# Patient Record
Sex: Male | Born: 1960 | Race: Black or African American | Hispanic: No | Marital: Married | Smoking: Former smoker
Health system: Southern US, Community
[De-identification: ages and names within clinical notes are randomized; demographics above are authoritative.]

## PROBLEM LIST (undated history)

## (undated) DIAGNOSIS — N400 Enlarged prostate without lower urinary tract symptoms: Secondary | ICD-10-CM

## (undated) HISTORY — PX: OTHER SURGICAL HISTORY: SHX169

---

## 2003-08-26 ENCOUNTER — Encounter: Admission: RE | Admit: 2003-08-26 | Discharge: 2003-08-26 | Payer: Self-pay | Admitting: Specialist

## 2005-02-13 ENCOUNTER — Emergency Department: Payer: Self-pay | Admitting: Emergency Medicine

## 2013-03-27 ENCOUNTER — Other Ambulatory Visit: Payer: Self-pay | Admitting: Otolaryngology

## 2013-03-27 DIAGNOSIS — H905 Unspecified sensorineural hearing loss: Secondary | ICD-10-CM

## 2013-04-02 ENCOUNTER — Ambulatory Visit
Admission: RE | Admit: 2013-04-02 | Discharge: 2013-04-02 | Disposition: A | Discharge status: Home / Self Care | Payer: BC Managed Care – PPO | Source: Ambulatory Visit | Attending: Otolaryngology | Admitting: Otolaryngology

## 2013-04-02 DIAGNOSIS — H905 Unspecified sensorineural hearing loss: Secondary | ICD-10-CM

## 2013-04-02 MED ORDER — GADOBENATE DIMEGLUMINE 529 MG/ML IV SOLN
18.0000 mL | Freq: Once | INTRAVENOUS | Status: AC | PRN
  Administered 2013-04-02: 18 mL via INTRAVENOUS

## 2014-09-04 ENCOUNTER — Encounter (HOSPITAL_COMMUNITY): Payer: Self-pay | Admitting: Emergency Medicine

## 2014-09-04 ENCOUNTER — Emergency Department (HOSPITAL_COMMUNITY)
Admission: EM | Admit: 2014-09-04 | Discharge: 2014-09-04 | Disposition: A | Discharge status: Home / Self Care | Payer: 59 | Source: Home / Self Care | Attending: Family Medicine | Admitting: Family Medicine

## 2014-09-04 DIAGNOSIS — J020 Streptococcal pharyngitis: Secondary | ICD-10-CM

## 2014-09-04 HISTORY — DX: Benign prostatic hyperplasia without lower urinary tract symptoms: N40.0

## 2014-09-04 LAB — POCT RAPID STREP A: STREPTOCOCCUS, GROUP A SCREEN (DIRECT): POSITIVE — AB

## 2014-09-04 MED ORDER — AMOXICILLIN 500 MG PO CAPS: 500.0000 mg | ORAL_CAPSULE | Freq: Two times a day (BID) | ORAL | Status: DC

## 2014-09-04 NOTE — Discharge Instructions (Signed)
You have a bacterial infection of your throat culture throat. This will require antibiotics in order to clear. Please take the antibiotics for the full 10 days. Please use ibuprofen 600 mg every 6-8 hours fever and general aches and pains. You may return to work after you have been without fever for 24 hours.  Strep Throat Strep throat is an infection of the throat caused by a bacteria named Streptococcus pyogenes. Your health care provider may call the infection streptococcal "tonsillitis" or "pharyngitis" depending on whether there are signs of inflammation in the tonsils or back of the throat. Strep throat is most common in children aged 5-15 years during the cold months of the year, but it can occur in people of any age during any season. This infection is spread from person to person (contagious) through coughing, sneezing, or other close contact. SIGNS AND SYMPTOMS   Fever or chills.  Painful, swollen, red tonsils or throat.  Pain or difficulty when swallowing.  White or yellow spots on the tonsils or throat.  Swollen, tender lymph nodes or "glands" of the neck or under the jaw.  Red rash all over the body (rare). DIAGNOSIS  Many different infections can cause the same symptoms. A test must be done to confirm the diagnosis so the right treatment can be given. A "rapid strep test" can help your health care provider make the diagnosis in a few minutes. If this test is not available, a light swab of the infected area can be used for a throat culture test. If a throat culture test is done, results are usually available in a day or two. TREATMENT  Strep throat is treated with antibiotic medicine. HOME CARE INSTRUCTIONS   Gargle with 1 tsp of salt in 1 cup of warm water, 3-4 times per day or as needed for comfort.  Family members who also have a sore throat or fever should be tested for strep throat and treated with antibiotics if they have the strep infection.  Make sure everyone in your  household washes their hands well.  Do not share food, drinking cups, or personal items that could cause the infection to spread to others.  You may need to eat a soft food diet until your sore throat gets better.  Drink enough water and fluids to keep your urine clear or pale yellow. This will help prevent dehydration.  Get plenty of rest.  Stay home from school, day care, or work until you have been on antibiotics for 24 hours.  Take medicines only as directed by your health care provider.  Take your antibiotic medicine as directed by your health care provider. Finish it even if you start to feel better. SEEK MEDICAL CARE IF:   The glands in your neck continue to enlarge.  You develop a rash, cough, or earache.  You cough up green, yellow-brown, or bloody sputum.  You have pain or discomfort not controlled by medicines.  Your problems seem to be getting worse rather than better.  You have a fever. SEEK IMMEDIATE MEDICAL CARE IF:   You develop any new symptoms such as vomiting, severe headache, stiff or painful neck, chest pain, shortness of breath, or trouble swallowing.  You develop severe throat pain, drooling, or changes in your voice.  You develop swelling of the neck, or the skin on the neck becomes red and tender.  You develop signs of dehydration, such as fatigue, dry mouth, and decreased urination.  You become increasingly sleepy, or you cannot wake  up completely. MAKE SURE YOU:  Understand these instructions.  Will watch your condition.  Will get help right away if you are not doing well or get worse. Document Released: 06/18/2000 Document Revised: 11/05/2013 Document Reviewed: 08/20/2010 [facility] Patient Information 2015 [place], [place]. This information is not intended to replace advice given to you by your health care provider. Make sure you discuss any questions you have with your health care provider.

## 2014-09-04 NOTE — ED Notes (Signed)
C/o cold sx onset yest Sx include: ST, HA, BA, fatigue, decreased appetite, fever Has been taking tyle and OTC cold meds w/no relief Alert, no signs of acute distress.

## 2014-09-04 NOTE — ED Provider Notes (Signed)
CSN: [id number]     Arrival date & time 09/04/14  0904 History   First MD Initiated Contact with Patient 09/04/14 [phone]     Chief Complaint  Patient presents with  . URI   (Consider location/radiation/quality/duration/timing/severity/associated sxs/prior Treatment) HPI  Developed body aches, HA, fevers, chills and sore throat. Started yesterday. Tylenol w/o much benefit. Dayquil w/o much benefit. Symptoms are are constant. CHills come and go. No change in overall symptoms since onset. Slept poorly due to symptoms.   Past Medical History  Diagnosis Date  . BPH (benign prostatic hyperplasia)    Past Surgical History  Procedure Laterality Date  . 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 Heart disease Father    History  Substance Use Topics  . Smoking status: Never Smoker   . Smokeless tobacco: Not on file  . Alcohol Use: Yes    Review of Systems Per HPI with all other pertinent systems negative.   Allergies  Review of patient's allergies indicates no known allergies.  Home Medications   Prior to Admission medications   Medication Sig Start Date End Date Taking? Authorizing Provider  tamsulosin (FLOMAX) 0.4 MG CAPS capsule Take 0.4 mg by mouth.   Yes Historical Provider, MD  amoxicillin (AMOXIL) 500 MG capsule Take 1 capsule (500 mg total) by mouth 2 (two) times daily. 09/04/14   Ozella Rocksavid J Merrell, MD   BP 127/70 mmHg  Pulse 76  Temp(Src) 100 F (37.8 C) (Oral)  Resp 18  SpO2 96% Physical Exam  Constitutional: He is oriented to person, place, and time. He appears well-developed and well-nourished.  HENT:  Head: Normocephalic and atraumatic.  Tonsils 0-1+ without exudate. Pharynx normal.  Eyes: EOM are normal. Pupils are equal, round, and reactive to light.  Neck: Normal range of motion.  Cardiovascular: Normal rate and normal heart sounds.   No murmur heard. Pulmonary/Chest: Effort normal  and breath sounds normal.  Abdominal: Soft. Bowel sounds are normal.  Musculoskeletal: Normal range of motion. He exhibits no edema or tenderness.  Neurological: He is alert and oriented to person, place, and time.  Skin: Skin is warm.  Psychiatric: His behavior is normal. Judgment and thought content normal.    ED Course  Procedures (including critical care time) Labs Review Labs Reviewed  POCT RAPID STREP A ([facility] ONLY) - Abnormal; Notable for the following:    Streptococcus, Group A Screen (Direct) POSITIVE (*)    All other components within normal limits    Imaging Review No results found.   MDM   1. Strep throat    Rapid strep positive. Start amoxicillin and NSAIDs/tylenol  Precautions given and all questions answered  Shelly Flattenavid Merrell, MD Family Medicine 09/04/2014, 10:09 AM      Ozella Rocksavid J Merrell, MD 09/04/14 1009

## 2016-04-26 ENCOUNTER — Ambulatory Visit (HOSPITAL_COMMUNITY)
Admission: EM | Admit: 2016-04-26 | Discharge: 2016-04-26 | Disposition: A | Discharge status: Home / Self Care | Payer: 59 | Attending: Family Medicine | Admitting: Family Medicine

## 2016-04-26 ENCOUNTER — Encounter (HOSPITAL_COMMUNITY): Payer: Self-pay | Admitting: Emergency Medicine

## 2016-04-26 DIAGNOSIS — M65841 Other synovitis and tenosynovitis, right hand: Secondary | ICD-10-CM

## 2016-04-26 DIAGNOSIS — M65331 Trigger finger, right middle finger: Secondary | ICD-10-CM | POA: Diagnosis not present

## 2016-04-26 MED ORDER — TRIAMCINOLONE ACETONIDE 40 MG/ML IJ SUSP: 40.0000 mg | Freq: Once | INTRAMUSCULAR | Status: DC

## 2016-04-26 MED ORDER — TRIAMCINOLONE ACETONIDE 40 MG/ML IJ SUSP
INTRAMUSCULAR | Status: AC
  Filled 2016-04-26: qty 1

## 2016-04-26 MED ORDER — DICLOFENAC POTASSIUM 50 MG PO TABS: 50.0000 mg | ORAL_TABLET | Freq: Three times a day (TID) | ORAL | 0 refills | Status: DC

## 2016-04-26 MED ORDER — LIDOCAINE HCL (PF) 1 % IJ SOLN
INTRAMUSCULAR | Status: AC
  Filled 2016-04-26: qty 30

## 2016-04-26 MED ORDER — LIDOCAINE HCL (PF) 1 % IJ SOLN: 5.0000 mL | Freq: Once | INTRAMUSCULAR | Status: DC

## 2016-04-26 NOTE — ED Provider Notes (Signed)
CSN: [id number]     Arrival date & time 04/26/16  1526 History   First MD Initiated Contact with Patient 04/26/16 1556     Chief Complaint  Patient presents with  . 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 SYMPTOMS PRESENT FOR A COUPLE OF MONTHS, HE FEELS IT IS GETTING WORSE.  Past Medical History:  Diagnosis Date  . BPH (benign prostatic hyperplasia)    Past Surgical History:  Procedure Laterality Date  . l hand amputation     as infant hand was caught in printing press   Family History  Problem Relation Age of Onset  . Heart disease Father    Social History  Substance Use Topics  . Smoking status: Former Games developer  . Smokeless tobacco: Former Neurosurgeon  . Alcohol use Yes    Review of Systems  Denies: HEADACHE, NAUSEA, ABDOMINAL PAIN, CHEST PAIN, CONGESTION, DYSURIA, SHORTNESS OF BREATH  Allergies  Review of patient's allergies indicates no known allergies.  Home Medications   Prior to Admission medications   Medication Sig Start Date End Date Taking? Authorizing Provider  tamsulosin (FLOMAX) 0.4 MG CAPS capsule Take 0.4 mg by mouth.   Yes Historical Provider, MD  amoxicillin (AMOXIL) 500 MG capsule Take 1 capsule (500 mg total) by mouth 2 (two) times daily. 09/04/14   Ozella Rocks, MD   Meds Ordered and Administered this Visit   Medications  triamcinolone acetonide (KENALOG-40) injection 40 mg (not administered)  lidocaine (PF) (XYLOCAINE) 1 % injection 5 mL (not administered)    BP 133/83 (BP Location: Right Arm)   Pulse 67   Temp 98.2 F (36.8 C) (Oral)   Resp 16   SpO2 98%  No data found.   Physical Exam NURSES NOTES AND VITAL SIGNS REVIEWED. CONSTITUTIONAL: Well developed, well nourished, no acute distress HEENT: normocephalic, atraumatic EYES: Conjunctiva normal NECK:normal ROM, supple, no adenopathy PULMONARY:No respiratory  distress, normal effort ABDOMINAL: Soft, ND, NT BS+, No CVAT MUSCULOSKELETAL: Normal ROM of all extremities, TRIGGER FINGER AT THE PIP FLEXOR.  SKIN: warm and dry without rash PSYCHIATRIC: Mood and affect, behavior are normal  Urgent Care Course   Clinical Course    Injection tendon or ligament Date/Time: 04/26/2016 5:51 PM Performed by: Tharon Aquas Authorized by: Bradd Canary D  Consent: Verbal consent obtained. Risks and benefits: risks, benefits and alternatives were discussed Consent given by: patient Patient understanding: patient states understanding of the procedure being performed Patient identity confirmed: verbally with patient Time out: Immediately prior to procedure a "time out" was called to verify the correct patient, procedure, equipment, support staff and site/side marked as required. Preparation: Patient was prepped and draped in the usual sterile fashion. Local anesthesia used: yes  Anesthesia: Local anesthesia used: yes Local Anesthetic: lidocaine 1% without epinephrine Anesthetic total: 1 mL  Sedation: Patient sedated: no Patient tolerance: Patient tolerated the procedure well with no immediate complications    (including critical care time) TRIGGER FINGER RELEASE NOT SUCCESSFUL.  Labs Review Labs Reviewed - No data to display  Imaging Review No results found.   Visual Acuity Review  Right Eye Distance:   Left Eye Distance:   Bilateral Distance:    Right Eye Near:   Left Eye Near:    Bilateral Near:         MDM   1. Trigger middle finger of right hand   2. 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 Patient is advised to continue home symptomatic treatment. Patient is advised that if there are new or worsening symptoms to attend the emergency department, contact primary care provider, or return to  UC. Instructions of care provided discharged home in stable condition.    THIS NOTE WAS GENERATED USING A VOICE RECOGNITION SOFTWARE PROGRAM. ALL REASONABLE EFFORTS  WERE MADE TO PROOFREAD THIS DOCUMENT FOR ACCURACY.  I have verbally reviewed the discharge instructions with the patient. A printed AVS was given to the patient.  All questions were answered prior to discharge.      Tharon AquasFrank C Yuri Fana, PA 04/26/16 [phone]

## 2016-04-26 NOTE — ED Triage Notes (Signed)
[facility] he states his Right middle finger locks when he moves it. He reports no injury to his finger, and this has been happening for about 2 months, but has gotten worse over the last week.

## 2016-10-01 ENCOUNTER — Encounter (HOSPITAL_COMMUNITY): Payer: Self-pay | Admitting: *Deleted

## 2016-10-01 ENCOUNTER — Ambulatory Visit (INDEPENDENT_AMBULATORY_CARE_PROVIDER_SITE_OTHER): Payer: 59

## 2016-10-01 ENCOUNTER — Ambulatory Visit (HOSPITAL_COMMUNITY)
Admission: EM | Admit: 2016-10-01 | Discharge: 2016-10-01 | Disposition: A | Discharge status: Home / Self Care | Payer: 59 | Attending: Internal Medicine | Admitting: Internal Medicine

## 2016-10-01 DIAGNOSIS — M722 Plantar fascial fibromatosis: Secondary | ICD-10-CM

## 2016-10-01 DIAGNOSIS — G8929 Other chronic pain: Secondary | ICD-10-CM

## 2016-10-01 DIAGNOSIS — M79671 Pain in right foot: Secondary | ICD-10-CM

## 2016-10-01 MED ORDER — MELOXICAM 15 MG PO TABS: 15.0000 mg | ORAL_TABLET | Freq: Every day | ORAL | 0 refills | Status: DC

## 2016-10-01 NOTE — ED Triage Notes (Signed)
Pt  Reports    Pain is  Worse  On  Weight  Bearing

## 2016-10-01 NOTE — Discharge Instructions (Signed)
°  Meloxicam (Mobic) is an antiinflammatory to help with pain and inflammation.  Do not take ibuprofen, Advil, Aleve, or any other medications that contain NSAIDs while taking meloxicam as this may cause stomach upset or even ulcers if taken in large amounts for an extended period of time.  ° °

## 2016-10-01 NOTE — ED Triage Notes (Signed)
r  Heel  Pain   X   4  Months   Worse  On  Walking    Denies   Any  Injury     Ambulated  To  Room         He   Is   Sitting  Upright on  Exam table  In no  Acute   Distress

## 2016-10-01 NOTE — ED Provider Notes (Signed)
CSN: [id number]     Arrival date & time 10/01/16  1300 History   First MD Initiated Contact with Patient 10/01/16 1340     Chief Complaint  Patient presents with  . Foot Pain   (Consider location/radiation/quality/duration/timing/severity/associated sxs/prior Treatment) HPI  Dominic Delgado is a 56 y.o. male presenting to UC with c/o 4 months of gradually worsening Right heel and posterior ankle pain. No known injury.  He has not tried anything for the pain.  Pain is aching and sore, 5/10, worse with weight bearing.    Past Medical History:  Diagnosis Date  . BPH (benign prostatic hyperplasia)    Past Surgical History:  Procedure Laterality Date  . l hand amputation     as infant hand was caught in printing press   Family History  Problem Relation Age of Onset  . Heart disease Father    Social History  Substance Use Topics  . Smoking status: Former Games developer  . Smokeless tobacco: Former Neurosurgeon  . Alcohol use Yes    Review of Systems  Musculoskeletal: Positive for arthralgias and gait problem. Negative for joint swelling and myalgias.  Skin: Negative for color change and wound.  Neurological: Negative for weakness and numbness.    Allergies  Patient has no known allergies.  Home Medications   Prior to Admission medications   Medication Sig Start Date End Date Taking? Authorizing Provider  amoxicillin (AMOXIL) 500 MG capsule Take 1 capsule (500 mg total) by mouth 2 (two) times daily. 09/04/14   Ozella Rocks, MD  diclofenac (CATAFLAM) 50 MG tablet Take 1 tablet (50 mg total) by mouth 3 (three) times daily. 04/26/16   Tharon Aquas, PA  meloxicam (MOBIC) 15 MG tablet Take 1 tablet (15 mg total) by mouth daily. 10/01/16   Junius Finner, PA-C  tamsulosin (FLOMAX) 0.4 MG CAPS capsule Take 0.4 mg by mouth.    Historical Provider, MD   Meds Ordered and Administered this Visit  Medications - No data to display  BP 112/78 (BP Location: Right Arm)   Pulse 76   Temp 99 F  (37.2 C) (Oral)   Resp 16   SpO2 97%  No data found.   Physical Exam  Constitutional: He is oriented to person, place, and time. He appears well-developed and well-nourished. No distress.  HENT:  Head: Normocephalic and atraumatic.  Eyes: EOM are normal.  Neck: Normal range of motion.  Cardiovascular: Normal rate.   Pulmonary/Chest: Effort normal.  Musculoskeletal: Normal range of motion. He exhibits tenderness. He exhibits no edema.  Right foot and ankle: no edema or deformity. Tenderness to bottom of heel and posterior. Full ROM Calf is soft, non-tender.   Neurological: He is alert and oriented to person, place, and time.  Skin: Skin is warm and dry. Capillary refill takes less than 2 seconds. No rash noted. He is not diaphoretic. No erythema.  Right ankle and foot: skin in tact. No ecchymosis or erythema.   Psychiatric: He has a normal mood and affect. His behavior is normal.  Nursing note and vitals reviewed.   Urgent Care Course     Procedures (including critical care time)  Labs Review Labs Reviewed - No data to display  Imaging Review Dg Foot Complete Right  Result Date: 10/01/2016 CLINICAL DATA:  Right heel pain for 3 months, no known injury EXAM: RIGHT FOOT COMPLETE - 3+ VIEW COMPARISON:  None. FINDINGS: Three views of the right foot submitted. No acute fracture or subluxation. Small plantar  spur of calcaneus. IMPRESSION: No acute fracture or subluxation.  Small plantar spur of calcaneus. Electronically Signed   By: Natasha Mead M.D.   On: 10/01/2016 14:01     MDM   1. Plantar fasciitis of right foot   2. Chronic heel pain, right    Pt c/o 4 months of foot pain w/o known injury.   Hx and exam c/w plantar fascitis   Rx: Meloxicam Home care instruction packet with home exercises provided f/u with PCP or orthopedist in 1 week if not improving.     Junius Finner, PA-C 10/01/16 [phone]

## 2019-02-12 ENCOUNTER — Encounter (HOSPITAL_COMMUNITY): Payer: Self-pay | Admitting: Emergency Medicine

## 2019-02-12 ENCOUNTER — Other Ambulatory Visit: Payer: Self-pay

## 2019-02-12 ENCOUNTER — Ambulatory Visit (HOSPITAL_COMMUNITY)
Admission: EM | Admit: 2019-02-12 | Discharge: 2019-02-12 | Disposition: A | Discharge status: Home / Self Care | Payer: Managed Care, Other (non HMO) | Attending: Family Medicine | Admitting: Family Medicine

## 2019-02-12 DIAGNOSIS — M5382 Other specified dorsopathies, cervical region: Secondary | ICD-10-CM

## 2019-02-12 DIAGNOSIS — M629 Disorder of muscle, unspecified: Secondary | ICD-10-CM

## 2019-02-12 MED ORDER — TIZANIDINE HCL 4 MG PO TABS: 4.0000 mg | ORAL_TABLET | Freq: Four times a day (QID) | ORAL | 0 refills | Status: DC | PRN

## 2019-02-12 MED ORDER — IBUPROFEN 800 MG PO TABS: 800.0000 mg | ORAL_TABLET | Freq: Three times a day (TID) | ORAL | 0 refills | Status: DC

## 2019-02-12 NOTE — ED Triage Notes (Signed)
Pt sts left upper back and shoulder pain x 4 days

## 2019-02-12 NOTE — ED Provider Notes (Signed)
[facility]    CSN: [id number] Arrival date & time: 02/12/19  1436     History   Chief Complaint Chief Complaint  Patient presents with  . Back Pain  . Shoulder Pain    HPI Jaxson Ala DachY Westendorf is a 58 y.o. male.   HPI  Works at post [employer].  Does a lot of lifting.  Is a Merchandiser, retailsupervisor;  Woke up with pain int he left neck and shoulder area 4 d ago.  Has tried ice and heat and massage.  No help.  No trauma.  Never had before. No pain in the shoulder joint. 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  Muscular atrophy of the LUE  Past Medical History:  Diagnosis Date  . BPH (benign prostatic hyperplasia)     There are no active problems to display for this patient.   Past Surgical History:  Procedure Laterality Date  . l hand amputation     as infant hand was caught in printing press       Home Medications    Prior to Admission medications   Medication Sig Start Date End Date Taking? Authorizing Provider  ibuprofen (ADVIL) 800 MG tablet Take 1 tablet (800 mg total) by mouth 3 (three) times daily. 02/12/19   Eustace MooreNelson, Demaya Hardge Sue, MD  tamsulosin (FLOMAX) 0.4 MG CAPS capsule Take 0.4 mg by mouth.    [provider]  tiZANidine (ZANAFLEX) 4 MG tablet Take 1-2 tablets (4-8 mg total) by mouth every 6 (six) hours as needed for muscle spasms. 02/12/19   Eustace MooreNelson, Laurelyn Terrero Sue, MD    Family History Family History  Problem Relation Age of Onset  . Heart disease Father     Social History Social History   Tobacco Use  . Smoking status: Former Games developermoker  . Smokeless tobacco: Former Engineer, waterUser  Substance Use Topics  . Alcohol use: Yes  . Drug use: No     Allergies   Patient has no known allergies.   Review of Systems Review of Systems  Constitutional: Negative for chills and fever.  HENT: Negative for ear pain and sore throat.   Eyes: Negative for pain and visual disturbance.  Respiratory: Negative for cough and shortness of breath.   Cardiovascular: Negative for  chest pain and palpitations.  Gastrointestinal: Negative for abdominal pain and vomiting.  Genitourinary: Negative for dysuria and hematuria.  Musculoskeletal: Positive for neck pain and neck stiffness. Negative for arthralgias and back pain.  Skin: Negative for color change and rash.  Neurological: Negative for seizures and syncope.  All other systems reviewed and are negative.    Physical Exam     Peak Flow      Pain Score 8     Pain Loc      Pain Edu?      Excl. in GC?      Vital Signs BP (!) 154/81 (BP Location: Right Arm)   Pulse 82   Temp 97.9 F (36.6 C) (Temporal)   Resp 18   SpO2 98%       Physical Exam Constitutional:      General: He is not in acute distress.    Appearance: He is well-developed and normal weight.  HENT:     Head: Normocephalic and atraumatic.  Eyes:     Conjunctiva/sclera: Conjunctivae normal.     Pupils: Pupils are equal, round, and reactive to light.  Neck:     Musculoskeletal: Normal range of motion.  Cardiovascular:  Rate and Rhythm: Normal rate and regular rhythm.     Heart sounds: Normal heart sounds. No murmur.  Pulmonary:     Effort: Pulmonary effort is normal. No respiratory distress.     Breath sounds: Normal breath sounds.  Abdominal:     General: There is no distension.     Palpations: Abdomen is soft.  Musculoskeletal: Normal range of motion.       Arms:  Skin:    General: Skin is warm and dry.  Neurological:     Mental Status: He is alert.      UC Treatments / Results  Labs (all labs ordered are listed, but only abnormal results are displayed) Labs Reviewed - No data to display  EKG   Radiology No results found.  Procedures Procedures (including critical care time)  Medications Ordered in UC Medications - No data to display  Initial Impression / Assessment and Plan / UC Course  I have reviewed the triage vital signs and the nursing notes.  Pertinent labs & imaging results that were available  during my care of the patient were reviewed by me and considered in my medical decision making (see chart for details).     Muscular neck pain.  Discussed treatment and recovery expectations Final Clinical Impressions(s) / UC Diagnoses   Final diagnoses:  Musculoskeletal disorder involving upper trapezius muscle     Discharge Instructions     Take ibuprofen 3 times a day with food Take tizanidine as needed as muscle relaxer.  This is useful to take at bedtime. Ice or heat to painful muscles. Gentle massage may help. Call your primary care doctor if you fail to improve in the next week.  You might need physical therapy    ED Prescriptions    Medication Sig Dispense Auth. Provider   ibuprofen (ADVIL) 800 MG tablet Take 1 tablet (800 mg total) by mouth 3 (three) times daily. 21 tablet Raylene Everts, MD   tiZANidine (ZANAFLEX) 4 MG tablet Take 1-2 tablets (4-8 mg total) by mouth every 6 (six) hours as needed for muscle spasms. 21 tablet Raylene Everts, MD     Controlled Substance Prescriptions [place] Controlled Substance Registry consulted? Not Applicable   Raylene Everts, MD 02/12/19 [phone]

## 2019-02-12 NOTE — Discharge Instructions (Signed)
Take ibuprofen 3 times a day with food Take tizanidine as needed as muscle relaxer.  This is useful to take at bedtime. Ice or heat to painful muscles. Gentle massage may help. Call your primary care doctor if you fail to improve in the next week.  You might need physical therapy

## 2021-01-01 ENCOUNTER — Ambulatory Visit (INDEPENDENT_AMBULATORY_CARE_PROVIDER_SITE_OTHER): Payer: Worker's Compensation

## 2021-01-01 ENCOUNTER — Encounter (HOSPITAL_COMMUNITY): Payer: Self-pay

## 2021-01-01 ENCOUNTER — Other Ambulatory Visit: Payer: Self-pay

## 2021-01-01 ENCOUNTER — Ambulatory Visit (HOSPITAL_COMMUNITY)
Admission: EM | Admit: 2021-01-01 | Discharge: 2021-01-01 | Disposition: A | Discharge status: Home / Self Care | Payer: Worker's Compensation | Attending: Internal Medicine | Admitting: Internal Medicine

## 2021-01-01 DIAGNOSIS — S39012A Strain of muscle, fascia and tendon of lower back, initial encounter: Secondary | ICD-10-CM

## 2021-01-01 DIAGNOSIS — M545 Low back pain, unspecified: Secondary | ICD-10-CM

## 2021-01-01 DIAGNOSIS — S300XXA Contusion of lower back and pelvis, initial encounter: Secondary | ICD-10-CM

## 2021-01-01 MED ORDER — KETOROLAC TROMETHAMINE 30 MG/ML IJ SOLN
INTRAMUSCULAR | Status: AC
  Filled 2021-01-01: qty 1

## 2021-01-01 MED ORDER — KETOROLAC TROMETHAMINE 30 MG/ML IJ SOLN
30.0000 mg | Freq: Once | INTRAMUSCULAR | Status: AC
  Administered 2021-01-01: 30 mg via INTRAMUSCULAR

## 2021-01-01 MED ORDER — METHOCARBAMOL 500 MG PO TABS: 500.0000 mg | ORAL_TABLET | Freq: Three times a day (TID) | ORAL | 0 refills | Status: DC | PRN

## 2021-01-01 NOTE — ED Triage Notes (Signed)
Pt presents with back and leg pain x 4 days.  States he had a fall on Monday and states he fell on his behind. Pt c/o pain when raising his leg.   Pt states he took Ibuprofen and states it did not give him much relief. He states when he walks he does not feel much pain, when sitting he feels the pain.

## 2021-01-01 NOTE — ED Provider Notes (Signed)
[facility]    CSN: [id number] Arrival date & time: 01/01/21  1318      History   Chief Complaint Chief Complaint  Patient presents with   Fall   Back Pain   Leg Pain    HPI Dominic Delgado is a 60 y.o. male otherwise healthy presents to urgent care after fall 4 days prior.  Patient states he stood up from rolling chair while at work on Monday and fell backwards directly onto his tailbone.  Reports pain in sacral region worse upon sitting.  He denies any weakness, numbness or tingling, no loss of bowel or bladder.  Ambulating without difficulty but having trouble getting up from sitting position.  Has tried Motrin without relief.    Past Medical History:  Diagnosis Date   BPH (benign prostatic hyperplasia)     There are no problems to display for this patient.   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 Authorizing Provider  methocarbamol (ROBAXIN) 500 MG tablet Take 1 tablet (500 mg total) by mouth every 8 (eight) hours as needed for muscle spasms. 01/01/21  Yes Rolla Etienne, NP  ibuprofen (ADVIL) 800 MG tablet Take 1 tablet (800 mg total) by mouth 3 (three) times daily. 02/12/19   Eustace Moore, MD  tamsulosin (FLOMAX) 0.4 MG CAPS capsule Take 0.4 mg by mouth.    [provider]    Family History Family History  Problem Relation Age of Onset   Heart disease Father     Social History Social History   Tobacco Use   Smoking status: Former    Pack years: 0.00   Smokeless tobacco: Former  Substance Use Topics   Alcohol use: Yes   Drug use: No     Allergies   Patient has no known allergies.   Review of Systems As stated in HPI otherwise negative   Physical Exam Triage Vital Signs ED Triage Vitals  Enc Vitals Group     BP 01/01/21 1413  129/81     Pulse Rate 01/01/21 1413 65     Resp 01/01/21 1413 17     Temp 01/01/21 1417 98.9 F (37.2 C)     Temp Source 01/01/21 1417 Oral     SpO2 01/01/21 1413 98 %     Weight --      Height --      Head Circumference --      Peak Flow --      Pain Score 01/01/21 1415 9     Pain Loc --      Pain Edu? --      Excl. in [place]? --    No data found.  Updated Vital Signs BP 129/81 (BP Location: Right Arm)   Pulse 65   Temp 98.9 F (37.2 C) (Oral)   Resp 17   SpO2 98%   Visual Acuity Right Eye Distance:   Left Eye Distance:   Bilateral Distance:    Right Eye Near:   Left Eye Near:    Bilateral Near:     Physical Exam Constitutional:      General: He is not in acute distress.    Appearance: Normal appearance. He is not ill-appearing or toxic-appearing.  Musculoskeletal:  General: Tenderness present.     Comments: TTP upon palpation of sacral region and paraspinal muscles in lumbar region.  No spinal tenderness.  Skin:    General: Skin is warm and dry.  Neurological:     General: No focal deficit present.     Mental Status: He is alert and oriented to person, place, and time.     Sensory: No sensory deficit.     Motor: No weakness.     Gait: Gait normal.  Psychiatric:        Mood and Affect: Mood normal.        Behavior: Behavior normal.     UC Treatments / Results  Labs (all labs ordered are listed, but only abnormal results are displayed) Labs Reviewed - No data to display  EKG   Radiology DG Sacrum/Coccyx  Result Date: 01/01/2021 CLINICAL DATA:  Pain after a fall.  Initial encounter. EXAM: SACRUM AND COCCYX - 2+ VIEW COMPARISON:  None. FINDINGS: There is no evidence of fracture or other focal bone lesions. Loss of disc space height and endplate spurring W8-0 and L5-S1 noted. IMPRESSION: No acute abnormality. Lower lumbar degenerative disease. Electronically Signed   By: Drusilla Kanner M.D.   On: 01/01/2021 15:23    Procedures Procedures  (including critical care time)  Medications Ordered in UC Medications  ketorolac (TORADOL) 30 MG/ML injection 30 mg (has no administration in time range)    Initial Impression / Assessment and Plan / UC Course  I have reviewed the triage vital signs and the nursing notes.  Pertinent labs & imaging results that were available during my care of the patient were reviewed by me and considered in my medical decision making (see chart for details).  Contusion, coccyx Lumbar strain -S/p fall from rolling chair.  No evidence of fracture on x-ray.  No sign of neurological involvement on exam. -Toradol IM in office.  Ibuprofen 600 mg every 8 hours as needed starting tomorrow, heat as directed.  Robaxin as needed for lumbar strain -Advised patient that inflatable doughnut pillow may also be helpful  Reviewed expections re: course of current medical issues. Questions answered. Outlined signs and symptoms indicating need for more acute intervention. Pt verbalized understanding. AVS given  Final Clinical Impressions(s) / UC Diagnoses   Final diagnoses:  Contusion of coccyx, initial encounter  Strain of lumbar region, initial encounter     Discharge Instructions      There is no evidence of any broken bones on your x-ray today.  I suspect you have a deep bruise on your tailbone.  As we discussed, sitting on an inflatable donut pillow may be helpful.  You can purchase this at any drugstore.  I have prescribed a muscle relaxer for your lower back pain.  This may make you sleepy so no driving after taking.  You have been given a shot of anti-inflammatory at urgent care.  As we discussed, no further ibuprofen for today.  You can begin taking ibuprofen 600 mg every 8 hours as needed tomorrow evening.  Apply heating pad to affected area several times daily for 15 to 20 minutes at a time.     ED Prescriptions     Medication Sig Dispense Auth. Provider   methocarbamol (ROBAXIN) 500 MG tablet Take  1 tablet (500 mg total) by mouth every 8 (eight) hours as needed for muscle spasms. 30 tablet Rolla Etienne, NP      PDMP not reviewed this encounter.   Rolla Etienne,  NP 01/01/21 1548

## 2021-01-01 NOTE — Discharge Instructions (Addendum)
There is no evidence of any broken bones on your x-ray today.  I suspect you have a deep bruise on your tailbone.  As we discussed, sitting on an inflatable donut pillow may be helpful.  You can purchase this at any drugstore.  I have prescribed a muscle relaxer for your lower back pain.  This may make you sleepy so no driving after taking.  You have been given a shot of anti-inflammatory at urgent care.  As we discussed, no further ibuprofen for today.  You can begin taking ibuprofen 600 mg every 8 hours as needed tomorrow evening.  Apply heating pad to affected area several times daily for 15 to 20 minutes at a time.

## 2021-12-09 ENCOUNTER — Encounter (HOSPITAL_COMMUNITY): Payer: Self-pay | Admitting: Emergency Medicine

## 2021-12-09 ENCOUNTER — Ambulatory Visit (HOSPITAL_COMMUNITY)
Admission: EM | Admit: 2021-12-09 | Discharge: 2021-12-09 | Disposition: A | Discharge status: Home / Self Care | Payer: 59 | Attending: Emergency Medicine | Admitting: Emergency Medicine

## 2021-12-09 DIAGNOSIS — M5442 Lumbago with sciatica, left side: Secondary | ICD-10-CM | POA: Diagnosis not present

## 2021-12-09 LAB — POCT URINALYSIS DIPSTICK, ED / UC
Bilirubin Urine: NEGATIVE
Glucose, UA: NEGATIVE mg/dL
Hgb urine dipstick: NEGATIVE
Ketones, ur: NEGATIVE mg/dL
Leukocytes,Ua: NEGATIVE
Nitrite: NEGATIVE
Protein, ur: NEGATIVE mg/dL
Specific Gravity, Urine: 1.02 (ref 1.005–1.030)
Urobilinogen, UA: 0.2 mg/dL (ref 0.0–1.0)
pH: 7 (ref 5.0–8.0)

## 2021-12-09 MED ORDER — CYCLOBENZAPRINE HCL 10 MG PO TABS: 10.0000 mg | ORAL_TABLET | Freq: Three times a day (TID) | ORAL | 0 refills | Status: AC

## 2021-12-09 MED ORDER — IBUPROFEN 400 MG PO TABS: 400.0000 mg | ORAL_TABLET | Freq: Four times a day (QID) | ORAL | 1 refills | Status: AC | PRN

## 2021-12-09 NOTE — ED Triage Notes (Signed)
Pt is present today with lower back pain and left thigh pain. Pt sx started x1 week ago.

## 2021-12-09 NOTE — Discharge Instructions (Signed)
Please take medicine as prescribed. The combination of the anti-inflammatory (ibuprofen) with the muscle relaxer (flexeril) will help with your back and leg pain.  Please follow up with your primary care provider if symptoms do not improve.

## 2021-12-09 NOTE — ED Provider Notes (Signed)
[name]    CSN: [id number] Arrival date & time: 12/09/21  [id number]     History   Chief Complaint Chief Complaint  Patient presents with   Back Pain   thigh pain    HPI Dominic Delgado is a 61 y.o. male.  Presents with 1 week history of left-sided low back pain that radiates into the left leg.  States leg pain is worse with bending forward.  He has tried occasional ibuprofen. He does take daily Flomax for BPH. Denies fever, recent illness, headache or changes in vision, injury or trauma to the back, abdominal pain, flank pain, vomiting/diarrhea, urinary symptoms/hematuria, saddle anesthesia, weakness or numbness and tingling in the extremities.  No history of cancer, non-smoker, no IV drug use.  At his job he does a lot of sitting.  He keeps his wallet in his back pocket.  Past Medical History:  Diagnosis Date   BPH (benign prostatic hyperplasia)     There are no problems to display for this patient.   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 Authorizing Provider  cyclobenzaprine (FLEXERIL) 10 MG tablet Take 1 tablet (10 mg total) by mouth 3 (three) times daily for 10 days. 12/09/21 12/19/21 Yes Azreal Stthomas, Wells Guiles, PA-C  ibuprofen (ADVIL) 400 MG tablet Take 1 tablet (400 mg total) by mouth every 6 (six) hours as needed for moderate pain. 12/09/21   Cassidi Modesitt, Wells Guiles, PA-C  tamsulosin (FLOMAX) 0.4 MG CAPS capsule Take 0.4 mg by mouth.    [provider]    Family History Family History  Problem Relation Age of Onset   Heart disease Father     Social History Social History   Tobacco Use   Smoking status: Former   Smokeless tobacco: Former  Substance Use Topics   Alcohol use: Yes   Drug use: No     Allergies   Patient has no known allergies.   Review of  Systems Review of Systems  Musculoskeletal:  Positive for back pain.  Per HPI  Physical Exam Triage Vital Signs ED Triage Vitals  Enc Vitals Group     BP 12/09/21 1013 (!) 146/88     Pulse Rate 12/09/21 1013 69     Resp 12/09/21 1013 17     Temp 12/09/21 1013 98.1 F (36.7 C)     Temp src --      SpO2 12/09/21 1013 100 %     Weight --      Height --      Head Circumference --      Peak Flow --      Pain Score 12/09/21 1012 10     Pain Loc --      Pain Edu? --      Excl. in [place]? --    No data found.  Updated Vital Signs BP (!) 146/88   Pulse 69   Temp 98.1 F (36.7 C)   Resp 17   SpO2 100%    Physical Exam Vitals and nursing note reviewed.  Constitutional:      General: He is not in acute distress. HENT:     Nose: Nose normal.     Mouth/Throat:     Mouth: Mucous membranes are moist.     Pharynx:  Oropharynx is clear.  Eyes:     Extraocular Movements: Extraocular movements intact.     Conjunctiva/sclera: Conjunctivae normal.     Pupils: Pupils are equal, round, and reactive to light.  Cardiovascular:     Rate and Rhythm: Normal rate and regular rhythm.     Heart sounds: Normal heart sounds.  Pulmonary:     Effort: Pulmonary effort is normal.     Breath sounds: Normal breath sounds.  Abdominal:     Palpations: Abdomen is soft.     Tenderness: There is no abdominal tenderness.  Musculoskeletal:     Cervical back: Normal.     Thoracic back: Normal.     Lumbar back: Tenderness present. No deformity. Positive left straight leg raise test.     Right lower leg: No edema.     Left lower leg: No edema.     Comments: No bony tenderness. Left lumbar paraspinals tender to palpation. Full ROM  Neurological:     Mental Status: He is alert and oriented to person, place, and time.     Cranial Nerves: No facial asymmetry.     Sensory: Sensation is intact.     Motor: Motor function is intact. No weakness.     Coordination: Coordination is intact.     Gait: Gait is  intact.     Comments: Strength 5/5 all extremities, grip strength 5/5 right hand. 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 Sensation intact, full ROM    UC Treatments / Results  Labs (all labs ordered are listed, but only abnormal results are displayed) Labs Reviewed  POCT URINALYSIS DIPSTICK, ED / UC   EKG  Radiology No results found.  Procedures Procedures (including critical care time)  Medications Ordered in UC Medications - No data to display  Initial Impression / Assessment and Plan / UC Course  I have reviewed the triage vital signs and the nursing notes.  Pertinent labs & imaging results that were available during my care of the patient were reviewed by me and considered in my medical decision making (see chart for details).  Urinalysis in clinic today negative. Patient has no red flag symptoms.  Patient history and physical exam consistent with sciatica.  Discussed with patient he should take ibuprofen every 6 hours consistently in combination with the muscle relaxer 3 times a day.  We also discussed not keeping his wallet in his back pocket, trying to get up and move around during the day instead of consistent sitting.  Discussed with patient that if the combination of these medicines does not help his pain he needs to follow-up with his primary care provider.  We discussed if symptoms worsen or he develops bladder or bowel incontinence, severe back pain, weakness or numbness and tingling, he needs to go to the emergency department.  Patient voices understanding.  He will also continue taking his daily Flomax. Return precautions discussed. Patient agrees to plan and is discharged in stable condition.  Final Clinical Impressions(s) / UC Diagnoses   Final diagnoses:  Acute left-sided low back pain with left-sided sciatica     Discharge Instructions      Please take medicine as prescribed. The combination of the anti-inflammatory (ibuprofen) with the muscle relaxer (flexeril) will  help with your back and leg pain.  Please follow up with your primary care provider if symptoms do not improve.     ED Prescriptions     Medication Sig Dispense Auth. Provider   cyclobenzaprine (FLEXERIL) 10 MG tablet Take 1 tablet (  10 mg total) by mouth 3 (three) times daily for 10 days. 30 tablet Breck Maryland, PA-C   ibuprofen (ADVIL) 400 MG tablet Take 1 tablet (400 mg total) by mouth every 6 (six) hours as needed for moderate pain. 50 tablet Kyaira Trantham, Wells Guiles, PA-C      PDMP not reviewed this encounter.   Teryn Gust, Wells Guiles, [place] 12/09/21 1121

## 2022-10-07 IMAGING — DX DG SACRUM/COCCYX 2+V
3 series · 3 of 3 positions shown · non-contrast
Comparison: None.

CLINICAL DATA: Pain after a fall.  Initial encounter.

EXAM:
SACRUM AND COCCYX - 2+ VIEW

[sacrum ap]
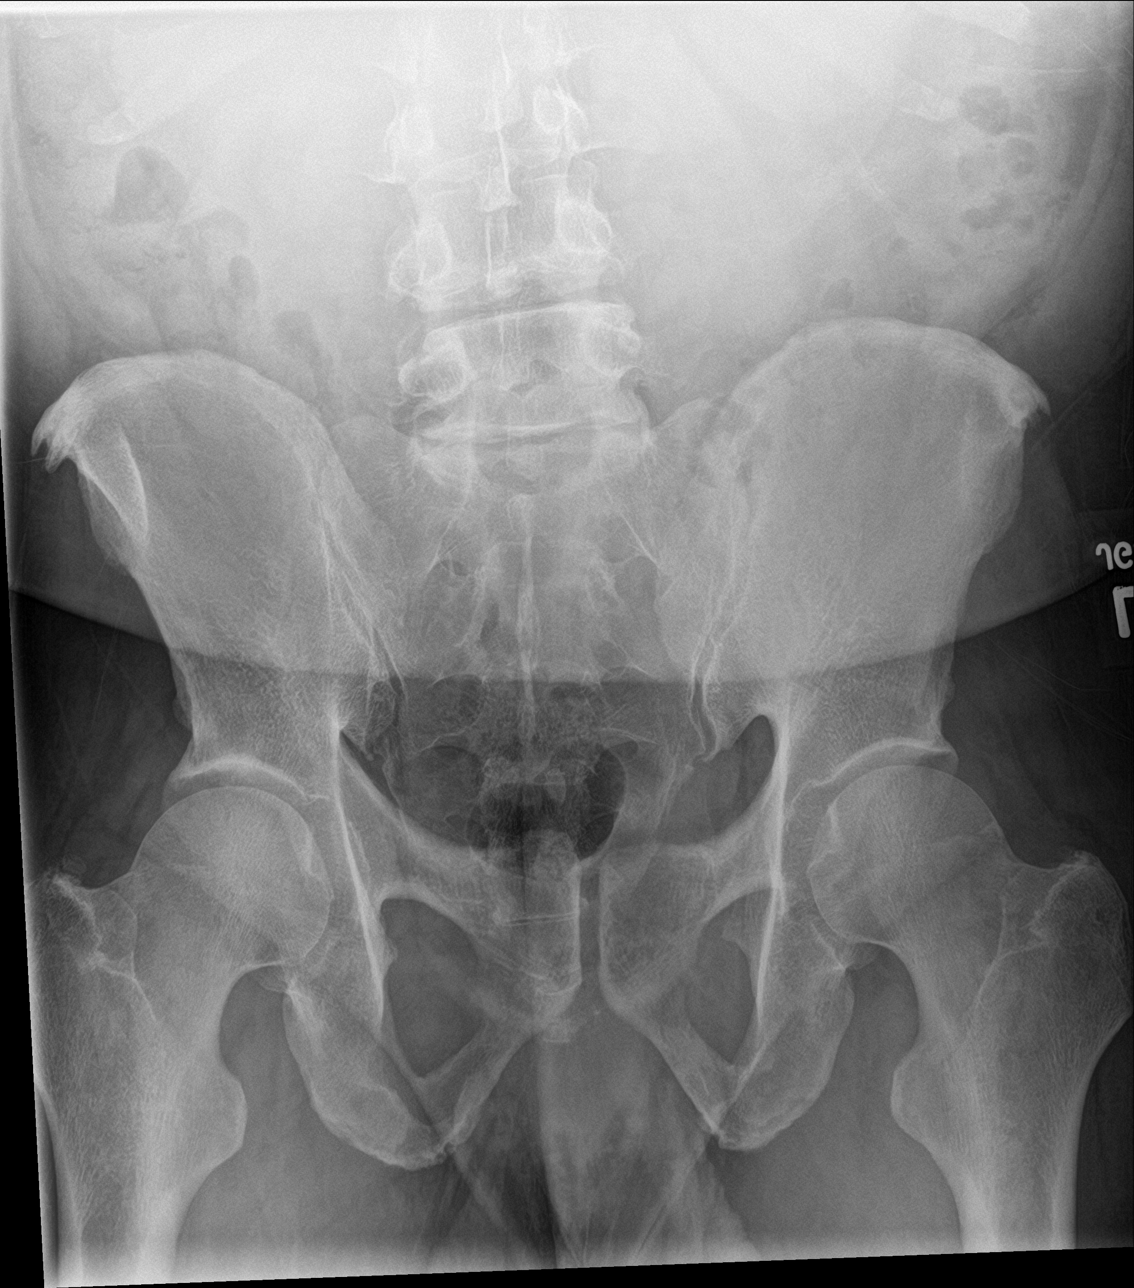

[sacrum lat]
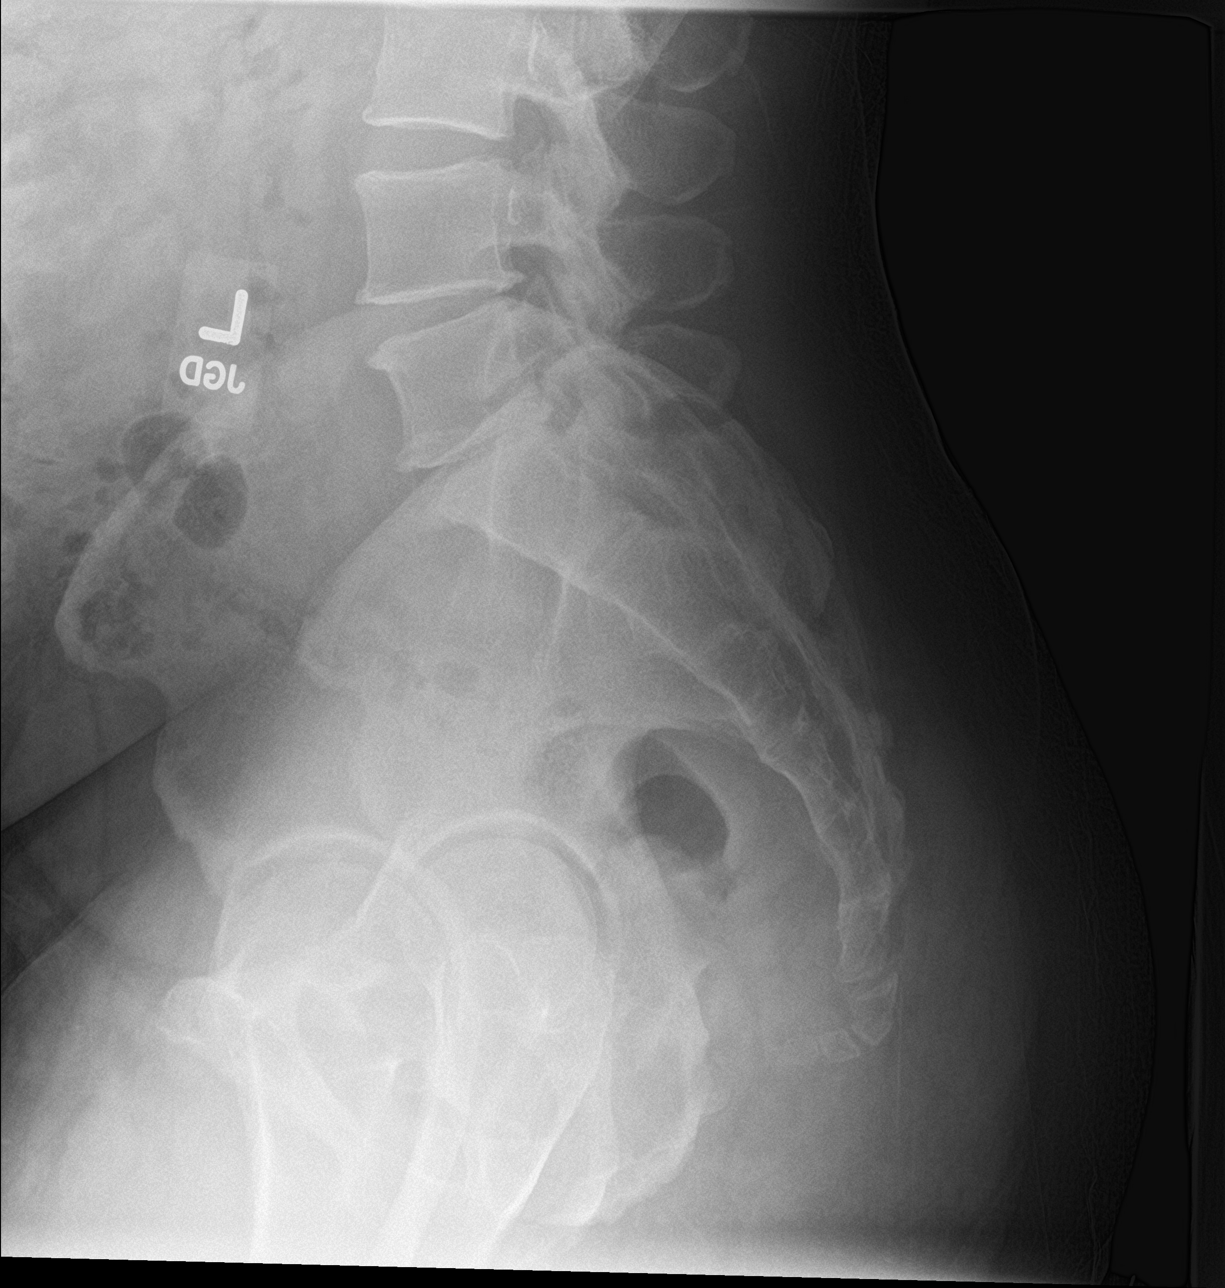

[coccyx ap]
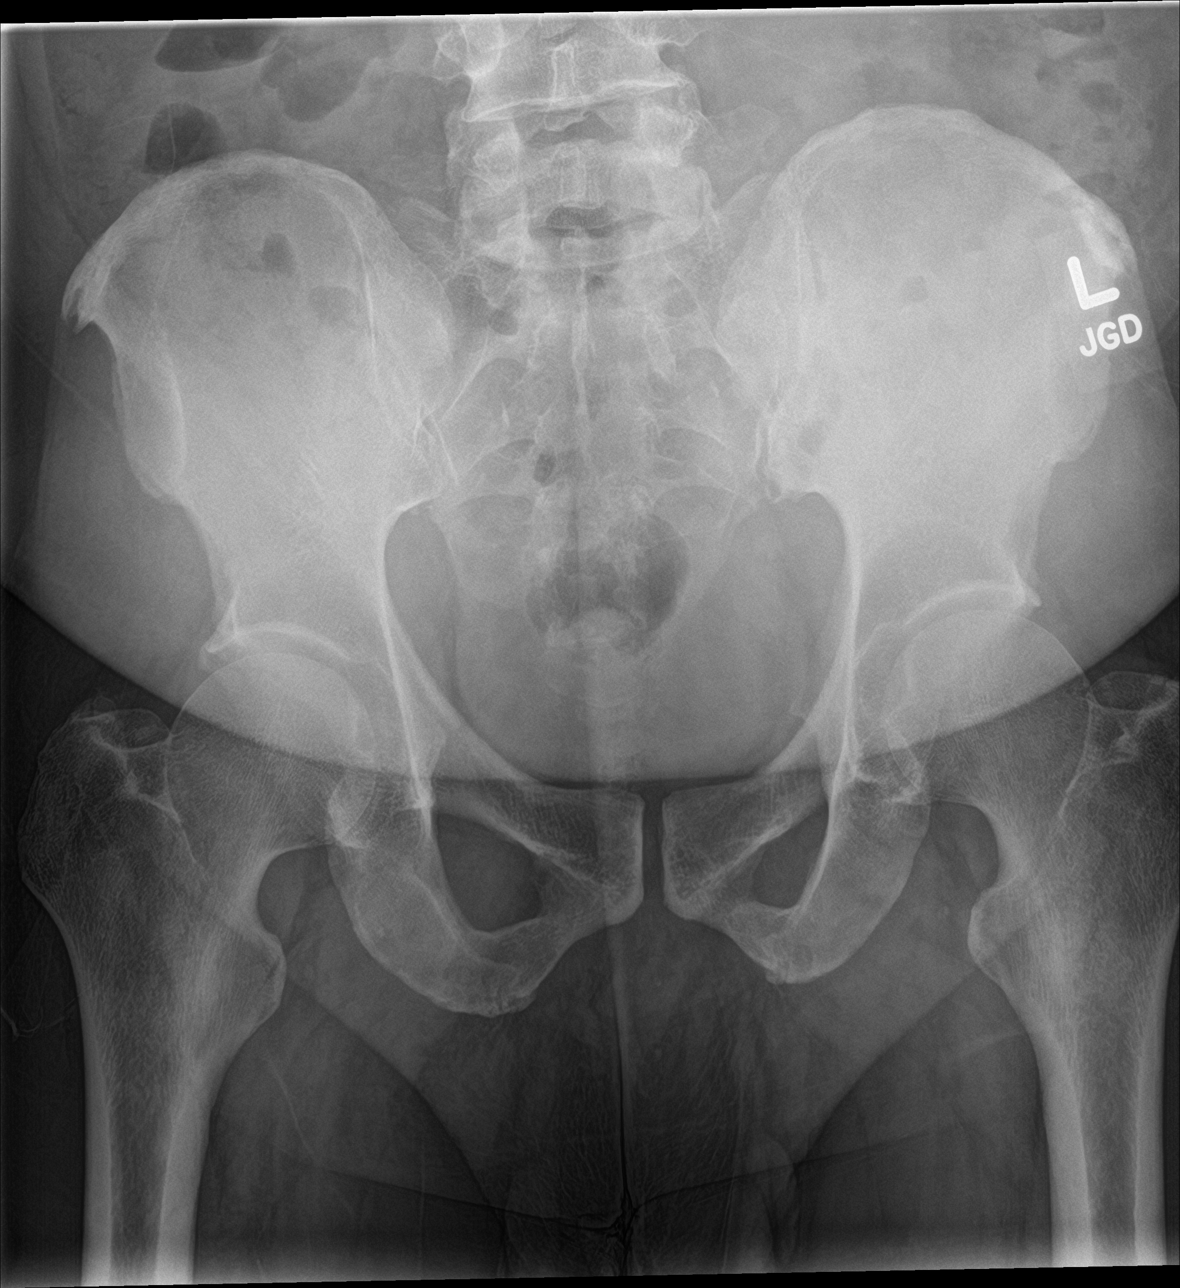

[3 of 3 positions shown; findings below may reference images not displayed]

FINDINGS: There is no evidence of fracture or other focal bone lesions. Loss
of disc space height and endplate spurring L4-5 and L5-S1 noted.
IMPRESSION: No acute abnormality.

Lower lumbar degenerative disease.

## 2023-11-25 ENCOUNTER — Ambulatory Visit (HOSPITAL_COMMUNITY): Admission: EM | Admit: 2023-11-25 | Discharge: 2023-11-25 | Discharge status: Against Medical Advice

## 2023-11-25 NOTE — ED Notes (Signed)
Informed by front desk staff that Patient LWBS before triage.
